# Patient Record
Sex: Male | Born: 1955 | Race: White | Hispanic: No | Smoking: Never smoker
Health system: Southern US, Community
[De-identification: ages and names within clinical notes are randomized; demographics above are authoritative.]

## PROBLEM LIST (undated history)

## (undated) DIAGNOSIS — I712 Thoracic aortic aneurysm, without rupture, unspecified: Secondary | ICD-10-CM

## (undated) DIAGNOSIS — I2699 Other pulmonary embolism without acute cor pulmonale: Secondary | ICD-10-CM

## (undated) DIAGNOSIS — I10 Essential (primary) hypertension: Secondary | ICD-10-CM

## (undated) HISTORY — PX: HAND SURGERY: SHX662

## (undated) HISTORY — PX: KNEE ARTHROSCOPY: SUR90

## (undated) HISTORY — PX: THYROID LOBECTOMY: SHX420

## (undated) HISTORY — DX: Thoracic aortic aneurysm, without rupture: I71.2

## (undated) HISTORY — DX: Thoracic aortic aneurysm, without rupture, unspecified: I71.20

---

## 2008-05-18 ENCOUNTER — Inpatient Hospital Stay: Payer: Self-pay | Admitting: Specialist

## 2010-02-12 ENCOUNTER — Emergency Department: Payer: Self-pay | Admitting: Emergency Medicine

## 2011-10-12 ENCOUNTER — Emergency Department: Payer: Self-pay | Admitting: Unknown Physician Specialty

## 2011-10-12 LAB — BASIC METABOLIC PANEL
Anion Gap: 8 (ref 7–16)
Calcium, Total: 9.3 mg/dL (ref 8.5–10.1)
Chloride: 108 mmol/L — ABNORMAL HIGH (ref 98–107)
Co2: 28 mmol/L (ref 21–32)
Creatinine: 1.07 mg/dL (ref 0.60–1.30)
EGFR (African American): >60
Osmolality: 289 (ref 275–301)

## 2011-10-12 LAB — CBC
MCV: 89 fL (ref 80–100)
Platelet: 161 10*3/uL (ref 150–440)
RDW: 13.6 % (ref 11.5–14.5)
WBC: 9.6 10*3/uL (ref 3.8–10.6)

## 2011-10-12 LAB — CK TOTAL AND CKMB (NOT AT ARMC): CK, Total: 267 U/L — ABNORMAL HIGH (ref 35–232)

## 2011-10-12 LAB — TROPONIN I: Troponin-I: <0.02 ng/mL

## 2011-10-13 LAB — HEPATIC FUNCTION PANEL A (ARMC)
Albumin: 3.7 g/dL (ref 3.4–5.0)
Alkaline Phosphatase: 76 U/L (ref 50–136)
Bilirubin,Total: 0.3 mg/dL (ref 0.2–1.0)
SGOT(AST): 24 U/L (ref 15–37)
SGPT (ALT): 36 U/L
Total Protein: 6.9 g/dL (ref 6.4–8.2)

## 2011-10-13 LAB — URINALYSIS, COMPLETE
Bacteria: NONE SEEN
Glucose,UR: NEGATIVE mg/dL (ref 0–75)
Leukocyte Esterase: NEGATIVE
Nitrite: NEGATIVE
Ph: 5 (ref 4.5–8.0)
Protein: NEGATIVE
RBC,UR: 1 /HPF (ref 0–5)
Specific Gravity: 1.028 (ref 1.003–1.030)
WBC UR: 1 /HPF (ref 0–5)

## 2011-10-13 LAB — MAGNESIUM: Magnesium: 2.2 mg/dL

## 2011-10-13 LAB — TSH: Thyroid Stimulating Horm: 0.749 u[IU]/mL

## 2011-12-29 ENCOUNTER — Emergency Department: Payer: Self-pay | Admitting: *Deleted

## 2011-12-29 LAB — COMPREHENSIVE METABOLIC PANEL
Albumin: 4.2 g/dL (ref 3.4–5.0)
Alkaline Phosphatase: 78 U/L (ref 50–136)
Anion Gap: 10 (ref 7–16)
Chloride: 106 mmol/L (ref 98–107)
Co2: 26 mmol/L (ref 21–32)
Creatinine: 0.95 mg/dL (ref 0.60–1.30)
EGFR (Non-African Amer.): >60
Osmolality: 283 (ref 275–301)
Potassium: 3.8 mmol/L (ref 3.5–5.1)
SGOT(AST): 25 U/L (ref 15–37)
Sodium: 142 mmol/L (ref 136–145)

## 2011-12-29 LAB — CBC
HCT: 44.8 % (ref 40.0–52.0)
MCHC: 34.4 g/dL (ref 32.0–36.0)
MCV: 87 fL (ref 80–100)
Platelet: 180 10*3/uL (ref 150–440)
RBC: 5.17 10*6/uL (ref 4.40–5.90)

## 2011-12-29 LAB — TROPONIN I: Troponin-I: <0.02 ng/mL

## 2012-01-12 ENCOUNTER — Ambulatory Visit: Payer: Self-pay | Admitting: Internal Medicine

## 2013-03-26 ENCOUNTER — Ambulatory Visit: Payer: Self-pay | Admitting: Physician Assistant

## 2013-12-29 IMAGING — CR DG ABDOMEN 3V
1 series · 6 of 6 positions shown · non-contrast
Comparison: none

REASON FOR EXAM: epigastric pain
COMMENTS:   May transport without cardiac monitor

[Series 1: w chest pa · 0.14mm/px · 6 of 6 slices shown]
[im 1/6]
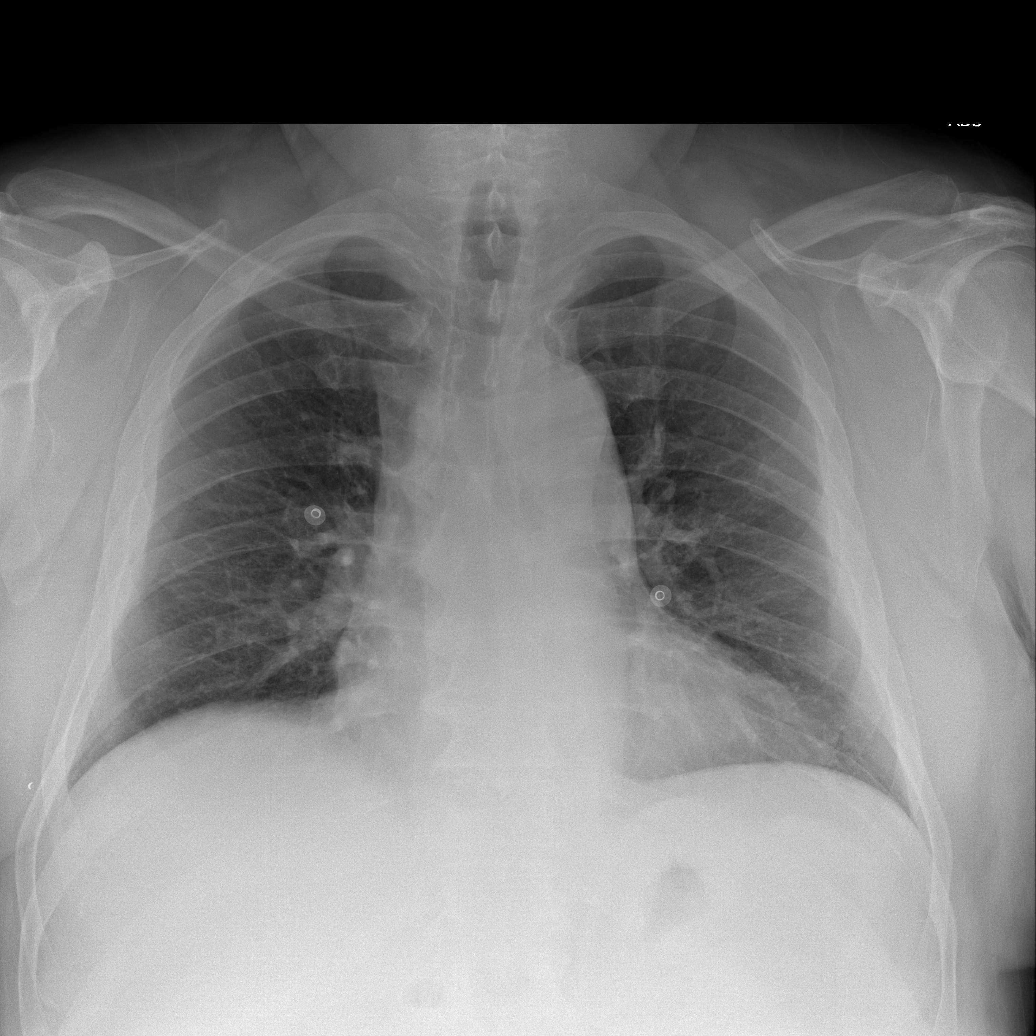
[im 2/6]
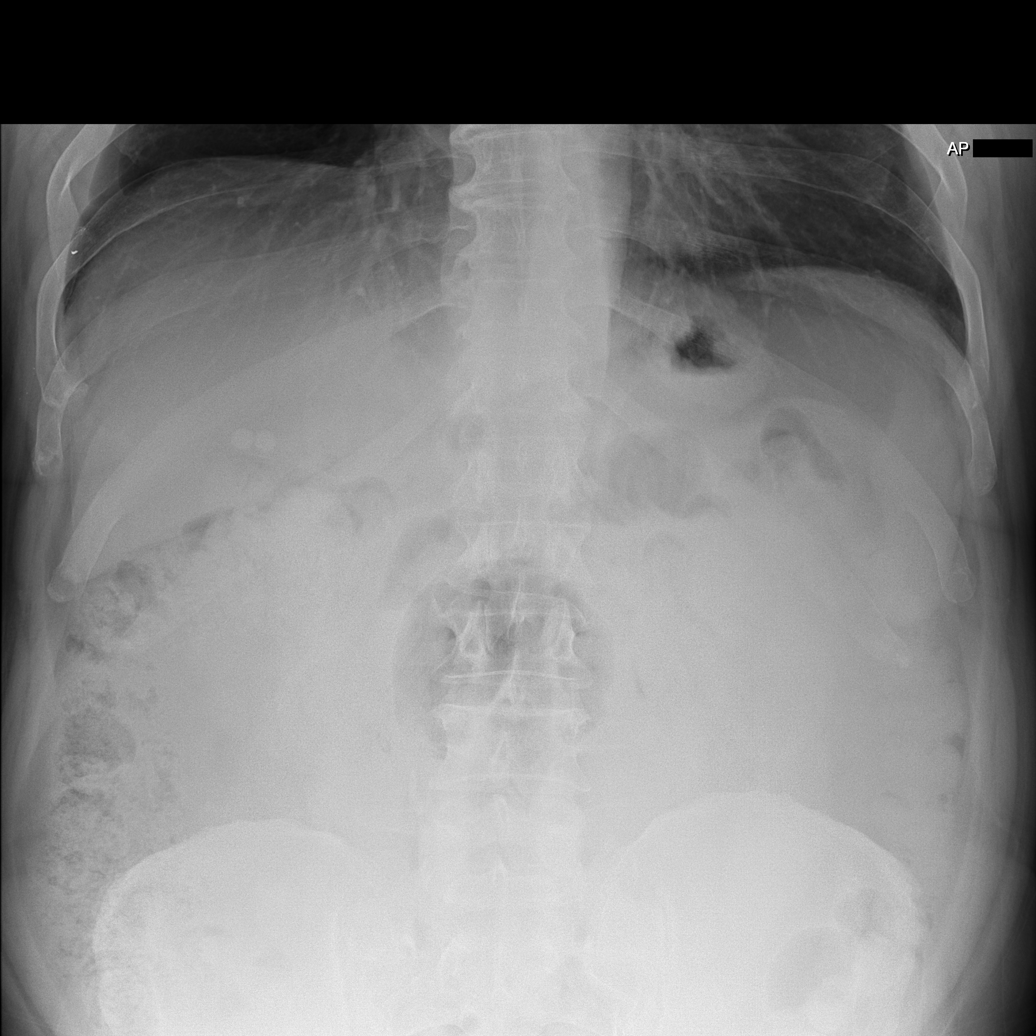
[im 3/6]
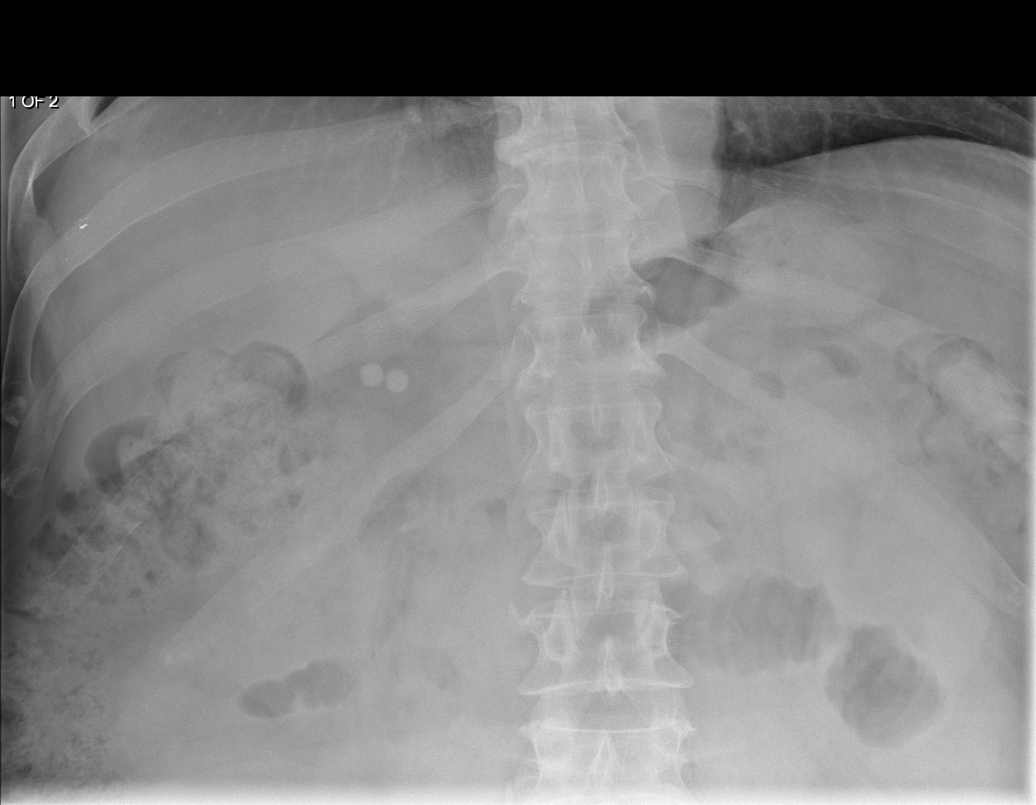
[im 4/6]
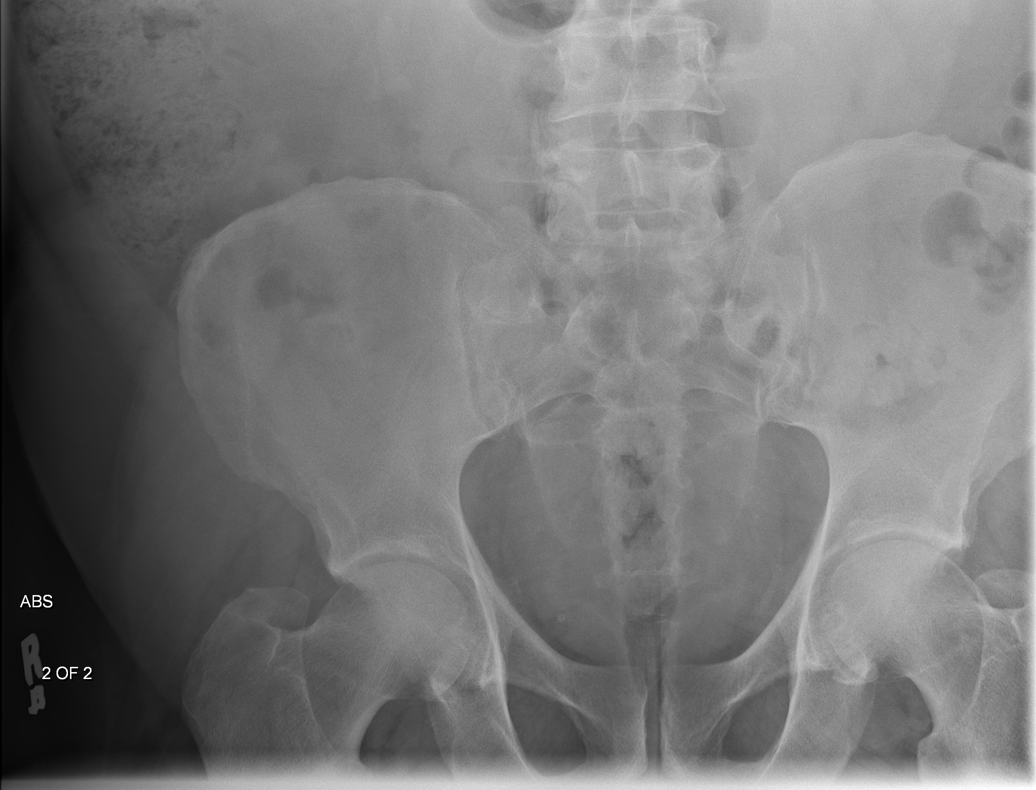
[im 5/6]
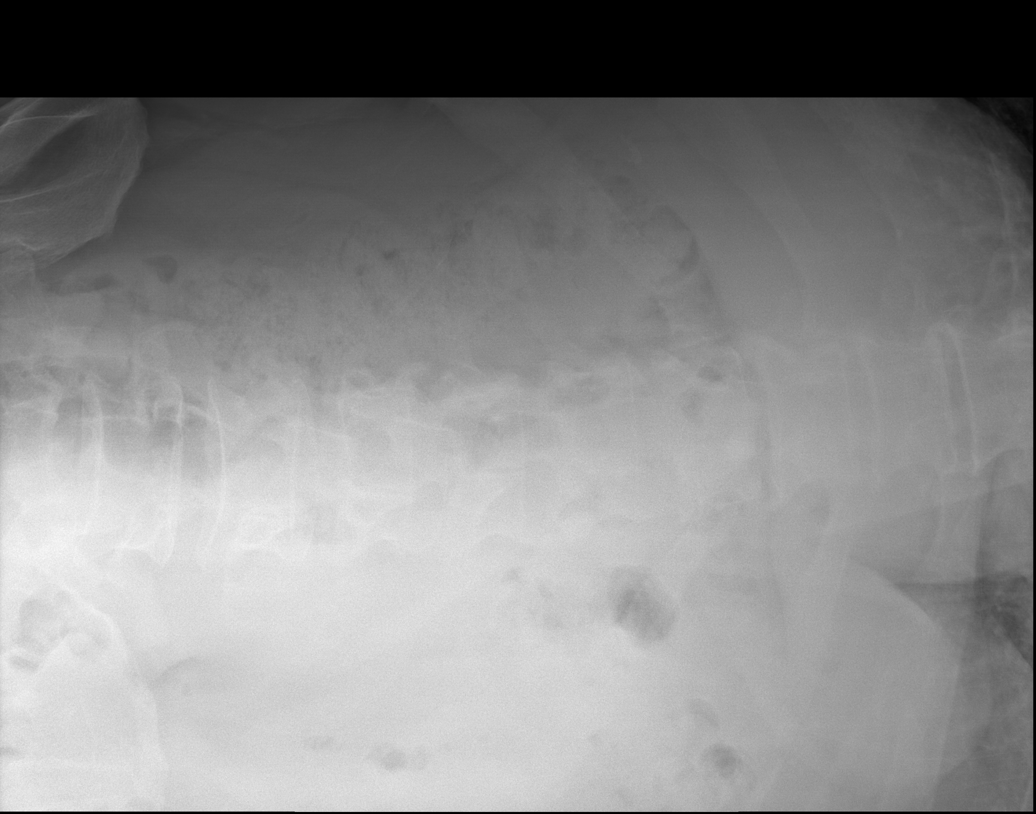
[im 6/6]
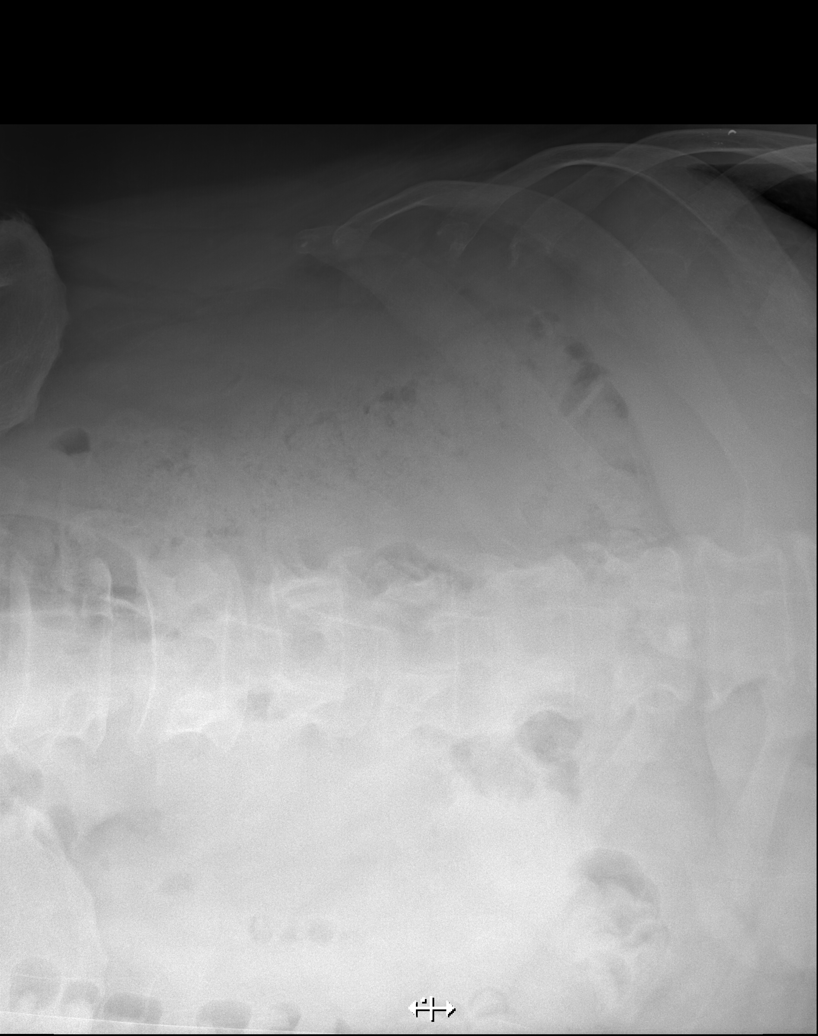

[6 of 6 positions shown; findings below may reference images not displayed]

PROCEDURE:     DXR - DXR ABDOMEN 3-WAY (INCL PA CXR)  - December 29, 2011  [DATE]

RESULT:     Comparison made to study 18 May, 2008.

The lungs are adequately inflated and clear. The cardiac silhouette is top
normal in size. The pulmonary vascularity is not engorged. There is no
pleural effusion. Within the abdomen the bowel gas pattern is relatively
nonspecific. No free extraluminal gas collections are demonstrated. There
are 2 rounded calcifications in the right upper quadrant most compatible
with gallstones. There is a moderate amount of stool within the right and
left colon. A small amount of small bowel gas is demonstrated. There is gas
in the rectum.
IMPRESSION: 1. There is no evidence of bowel obstruction or ileus. There may be
constipation present.
2. Two rounded calcific densities in the right upper quadrant of the abdomen
may reflect gallstones.
3. There is no evidence of acute cardiopulmonary abnormality.

[REDACTED]

## 2014-09-19 ENCOUNTER — Emergency Department: Payer: Federal, State, Local not specified - PPO

## 2014-09-19 ENCOUNTER — Other Ambulatory Visit: Payer: Self-pay

## 2014-09-19 ENCOUNTER — Emergency Department
Admission: EM | Admit: 2014-09-19 | Discharge: 2014-09-19 | Disposition: A | Discharge status: Home / Self Care | Payer: Federal, State, Local not specified - PPO | Attending: Emergency Medicine | Admitting: Emergency Medicine

## 2014-09-19 ENCOUNTER — Encounter: Payer: Self-pay | Admitting: Emergency Medicine

## 2014-09-19 DIAGNOSIS — R52 Pain, unspecified: Secondary | ICD-10-CM

## 2014-09-19 DIAGNOSIS — Z880 Allergy status to penicillin: Secondary | ICD-10-CM | POA: Insufficient documentation

## 2014-09-19 DIAGNOSIS — I10 Essential (primary) hypertension: Secondary | ICD-10-CM | POA: Diagnosis not present

## 2014-09-19 DIAGNOSIS — I712 Thoracic aortic aneurysm, without rupture: Secondary | ICD-10-CM | POA: Diagnosis not present

## 2014-09-19 DIAGNOSIS — R05 Cough: Secondary | ICD-10-CM | POA: Diagnosis present

## 2014-09-19 DIAGNOSIS — R0789 Other chest pain: Secondary | ICD-10-CM | POA: Diagnosis not present

## 2014-09-19 DIAGNOSIS — R079 Chest pain, unspecified: Secondary | ICD-10-CM

## 2014-09-19 HISTORY — DX: Essential (primary) hypertension: I10

## 2014-09-19 HISTORY — DX: Other pulmonary embolism without acute cor pulmonale: I26.99

## 2014-09-19 LAB — BASIC METABOLIC PANEL
ANION GAP: 10 (ref 5–15)
BUN: 16 mg/dL (ref 6–20)
CHLORIDE: 104 mmol/L (ref 101–111)
CO2: 25 mmol/L (ref 22–32)
CREATININE: 1.08 mg/dL (ref 0.61–1.24)
Calcium: 9.3 mg/dL (ref 8.9–10.3)
GFR calc Af Amer: >60 mL/min (ref >60)
GFR calc non Af Amer: >60 mL/min (ref >60)
Glucose, Bld: 96 mg/dL (ref 65–99)
Potassium: 3.7 mmol/L (ref 3.5–5.1)
SODIUM: 139 mmol/L (ref 135–145)

## 2014-09-19 LAB — URINALYSIS COMPLETE WITH MICROSCOPIC (ARMC ONLY)
BILIRUBIN URINE: NEGATIVE
Bacteria, UA: NONE SEEN
Glucose, UA: NEGATIVE mg/dL
KETONES UR: NEGATIVE mg/dL
Leukocytes, UA: NEGATIVE
NITRITE: NEGATIVE
PH: 5 (ref 5.0–8.0)
Protein, ur: NEGATIVE mg/dL
RBC / HPF: NONE SEEN RBC/hpf (ref 0–5)
SPECIFIC GRAVITY, URINE: 1.019 (ref 1.005–1.030)

## 2014-09-19 LAB — CBC
HCT: 52.5 % — ABNORMAL HIGH (ref 40.0–52.0)
HEMOGLOBIN: 17 g/dL (ref 13.0–18.0)
MCH: 28.2 pg (ref 26.0–34.0)
MCHC: 32.3 g/dL (ref 32.0–36.0)
MCV: 87.2 fL (ref 80.0–100.0)
Platelets: 192 10*3/uL (ref 150–440)
RBC: 6.02 MIL/uL — ABNORMAL HIGH (ref 4.40–5.90)
RDW: 14.5 % (ref 11.5–14.5)
WBC: 9.3 10*3/uL (ref 3.8–10.6)

## 2014-09-19 LAB — HEPATIC FUNCTION PANEL
ALBUMIN: 4.2 g/dL (ref 3.5–5.0)
ALT: 29 U/L (ref 17–63)
AST: 21 U/L (ref 15–41)
Alkaline Phosphatase: 60 U/L (ref 38–126)
Bilirubin, Direct: 0.1 mg/dL (ref 0.1–0.5)
Indirect Bilirubin: 0.8 mg/dL (ref 0.3–0.9)
Total Bilirubin: 0.9 mg/dL (ref 0.3–1.2)
Total Protein: 7.9 g/dL (ref 6.5–8.1)

## 2014-09-19 LAB — TROPONIN I

## 2014-09-19 LAB — LIPASE, BLOOD: Lipase: 29 U/L (ref 22–51)

## 2014-09-19 MED ORDER — IOHEXOL 350 MG/ML SOLN
100.0000 mL | Freq: Once | INTRAVENOUS | Status: AC | PRN
  Administered 2014-09-19: 100 mL via INTRAVENOUS
  Filled 2014-09-19: qty 100

## 2014-09-19 NOTE — ED Provider Notes (Signed)
I have reviewed the mid-level's documentation and was available to the mid-level if needed during evaluation of this patient.  I've spoken with Jose Stewart about the care of this patient. He had vague nondescript burning, warm sensation through his lower chest and upper abdomen. Next line  ED ECG REPORT I, Chiyeko Ferre W, the attending physician, personally viewed and interpreted this ECG.   Date: 09/19/2014  EKG Time: 11:59 AM  Rate: 79  Rhythm:  Normal sinus rhythm  Axis: left axis at -33  Intervals:  Normal  ST&T Change:  Slightly downgoing T wave in lead 3   Darien Ramus, MD 09/19/14 1711

## 2014-09-19 NOTE — ED Notes (Signed)
Pt presents with cough, fever and feelings of burning on the inside of his body for one week. Pt also reports n/v/d. Pt reports pulling four ticks off of him about 10 days ago.

## 2014-09-19 NOTE — ED Provider Notes (Signed)
[facility] Emergency Department Provider Note  ____________________________________________  Time seen: 1504  I have reviewed the triage vital signs and the nursing notes.   HISTORY  Chief Complaint Cough    HPI Jose Stewart is a 59 y.o. 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 PE (pulmonary embolism)   . Hypertension     There are no active problems to display for this patient.   Past Surgical History  Procedure Laterality Date  . Hand surgery    . Knee arthroscopy      No current outpatient prescriptions on file.  Allergies Augmentin; Keflex; and Niaspan  No family history on file.  Social History History  Substance Use Topics  . Smoking status: Never Smoker   . Smokeless tobacco: Not on file  . Alcohol Use: No    Review of Systems Constitutional: No fever/chills Eyes: No visual changes. ENT: No sore throat. Cardiovascular: Denies chest pain. Respiratory: Denies shortness of breath. Gastrointestinal: No abdominal pain.  No nausea, no vomiting.  No diarrhea.  No constipation. Genitourinary: Negative for dysuria. Musculoskeletal: Negative for back pain. Skin: Negative for rash. Neurological: Negative for headaches, focal weakness or numbness.  10-point ROS otherwise  negative.  ____________________________________________   PHYSICAL EXAM:  VITAL SIGNS: ED Triage Vitals  Enc Vitals Group     BP 09/19/14 1150 153/106 mmHg     Pulse Rate 09/19/14 1150 83     Resp 09/19/14 1150 20     Temp 09/19/14 1150 98.1 F (36.7 C)     Temp Source 09/19/14 1150 Oral     SpO2 09/19/14 1150 96 %     Weight 09/19/14 1150 280 lb (127.007 kg)     Height 09/19/14 1150  (1.854 m)     Head Cir --      Peak Flow --      Pain Score 09/19/14 1151 7     Pain Loc --      Pain Edu? --      Excl. in GC? --     Constitutional: Alert and oriented. Well appearing and in no acute distress. Eyes: Conjunctivae are normal. PERRL. EOMI. Head: Atraumatic. Nose: No congestion/rhinnorhea. Mouth/Throat: Mucous membranes are moist.  Oropharynx non-erythematous. Neck: No stridor.   Hematological/Lymphatic/Immunilogical: No cervical lymphadenopathy. Cardiovascular: Normal rate, regular rhythm. Grossly normal heart sounds.  Good peripheral circulation. Respiratory: Normal respiratory effort.  No retractions. Lungs CTAB. Gastrointestinal: Soft and nontender. No distention. No abdominal bruits. No CVA tenderness. Musculoskeletal: No lower extremity tenderness nor edema.  No joint effusions. Neurologic:  Normal speech and language. No gross focal neurologic deficits are appreciated. Speech is normal. No gait instability. Skin:  Skin is warm, dry and intact. No rash noted. Psychiatric: Patient is mildly anxious. Speech and behavior are normal.  ____________________________________________  LABS (all labs ordered are listed, but only abnormal results are displayed)  Labs Reviewed  CBC - Abnormal; Notable for the following:    RBC 6.02 (*)    HCT 52.5 (*)    All other components within normal limits  URINALYSIS COMPLETEWITH MICROSCOPIC ([facility] ONLY) - Abnormal; Notable for the following:    Color, Urine YELLOW (*)    APPearance CLEAR (*)    Hgb urine dipstick 1+ (*)     Squamous Epithelial / LPF 0-5 (*)    All other components within normal limits  BASIC METABOLIC PANEL  TROPONIN I  HEPATIC FUNCTION PANEL  LIPASE, BLOOD     RADIOLOGY  IMPRESSION: No demonstrable pulmonary embolus.  Prominence of the ascending thoracic aorta with a maximum transverse diameter of 5.0 x 4.7 cm. Ascending thoracic aortic aneurysm. Recommend semi-annual imaging followup by CTA or MRA and referral to cardiothoracic surgery if not already obtained. This recommendation follows 2010 ACCF/AHA/AATS/ACR/ASA/SCA/SCAI/SIR/STS/SVM Guidelines for the Diagnosis and Management of Patients With Thoracic Aortic Disease. Circulation. 2010; 121: [id number]  No lung edema or consolidation.  No adenopathy.  Cholelithiasis. There is hepatic steatosis.   Electronically Signed By: Bretta Bang Chauna Osoria M.D. On: 09/19/2014 16:58 ____________________________________________   PROCEDURES  Procedure(s) performed: None  Critical Care performed: No  ____________________________________________   INITIAL IMPRESSION / ASSESSMENT AND PLAN / ED COURSE  Pertinent labs & imaging results that were available during my care of the patient were reviewed by me and considered in my medical decision making (see chart for details).  Initial impression on this patient is pain etiology is unclear as to what it is as well as having a develop and thoracic aortic aneurysm we discussed the finding with him from his CT scan whilst reassuring that the pleuritic type pain that is having is not from clots we do need [facility] for continued observation and management of this patient's labs were all within normal limits nothing strikingly could be causing his vague symptoms recommend that he discuss both the testosterone antidepressives and studies on with his primary care provider return here for any acute concerns or worsening  symptoms ____________________________________________   FINAL CLINICAL IMPRESSION(S) / ED DIAGNOSES  Final diagnoses:  Chest pain  Pain   pain of unclear etiology ascending thoracic aortic aneurysm   Koda Defrank Rosalyn Gess, PA-C 09/19/14 1808  Darien Ramus, MD 09/19/14 [phone]

## 2016-09-19 IMAGING — CT CT ANGIO CHEST
1 of 2 series · 18 of 30 positions shown · IV contrast (omnipaque)
Comparison: Chest radiograph September 19, 2014

CLINICAL DATA: Chest pain and cough ; fever

EXAM:
CT ANGIOGRAPHY CHEST WITH CONTRAST
TECHNIQUE: Multidetector CT imaging of the chest was performed using the
standard protocol during bolus administration of intravenous
contrast. Multiplanar CT image reconstructions and MIPs were
obtained to evaluate the vascular anatomy.
CONTRAST:  100mL OMNIPAQUE IOHEXOL 350 MG/ML SOLN

[Series 5: pe 1.0 thins · axial · 0.88mm/px · z∈[+6,+273]mm · 18 of 301 slices shown]
[im 17/301  lung]
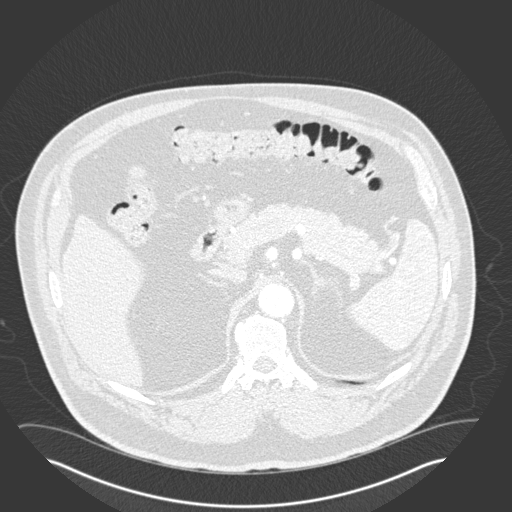
[im 34/301  mediastinal]
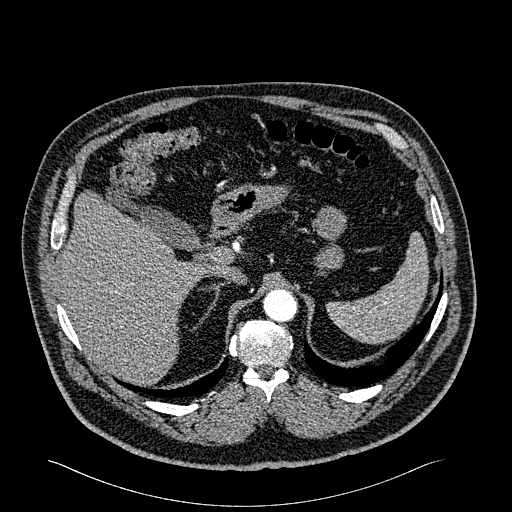
[im 51/301  lung]
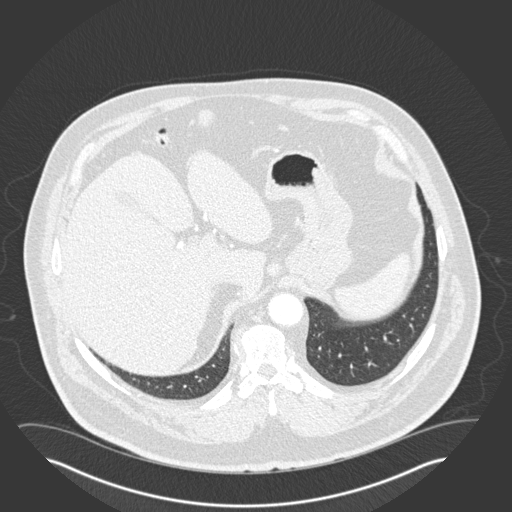
[im 67/301  mediastinal]
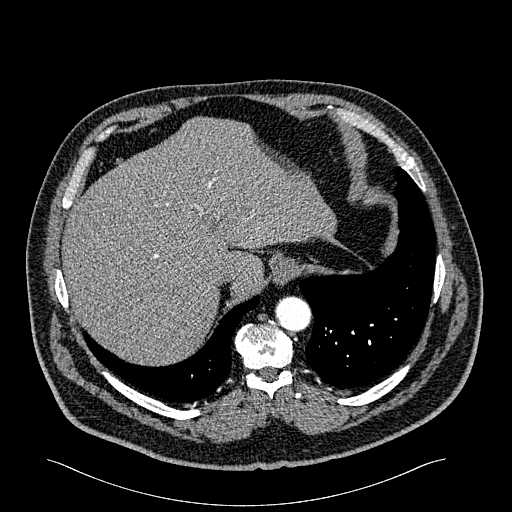
[im 84/301  lung]
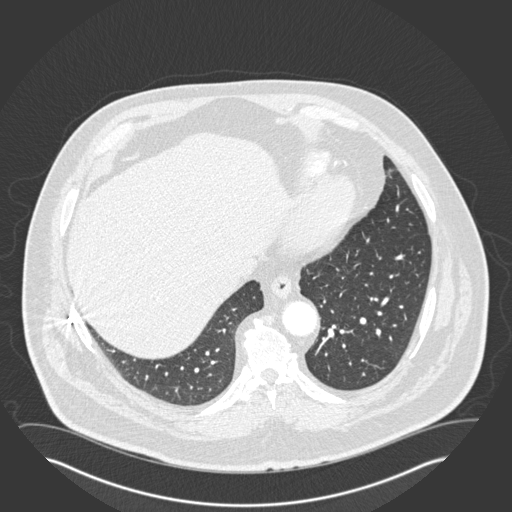
[im 101/301  mediastinal]
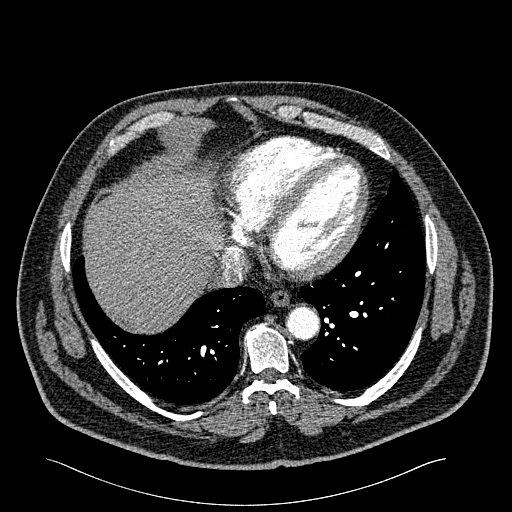
[im 117/301  lung]
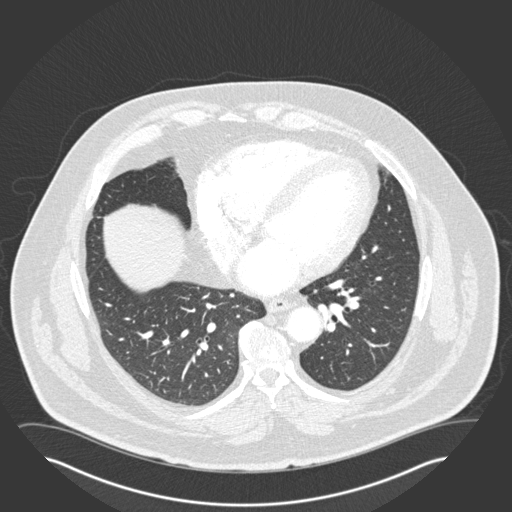
[im 134/301  mediastinal]
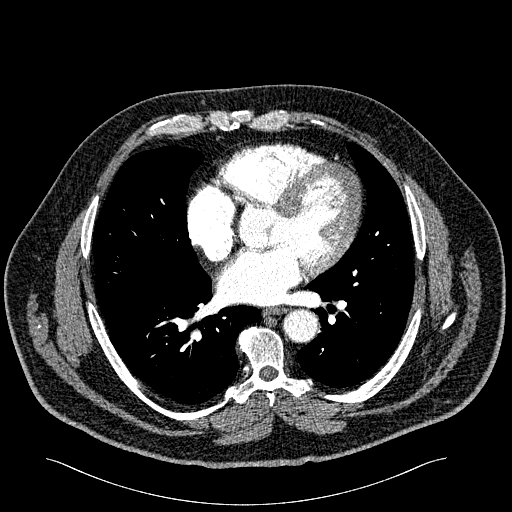
[im 141/301  lung]
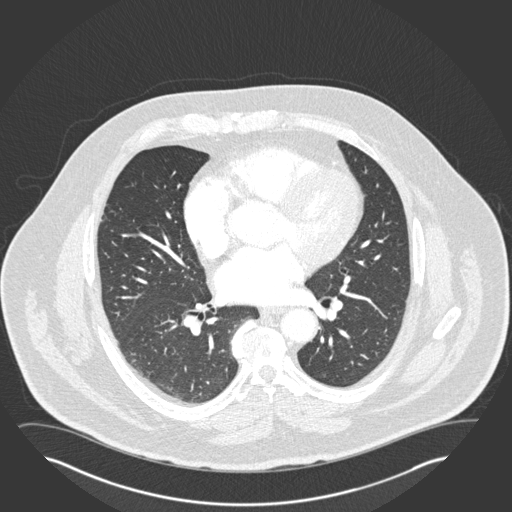
[im 151/301  mediastinal]
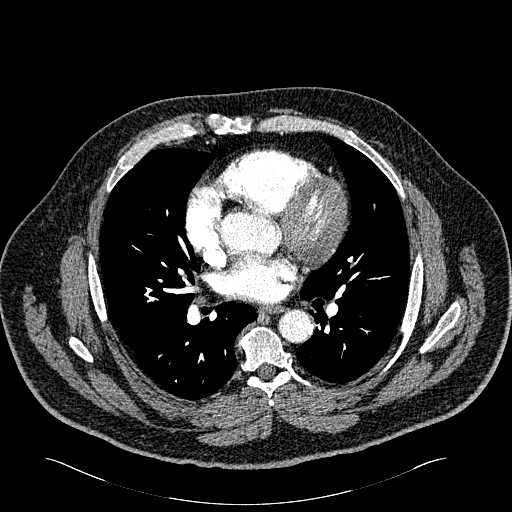
[im 167/301  lung]
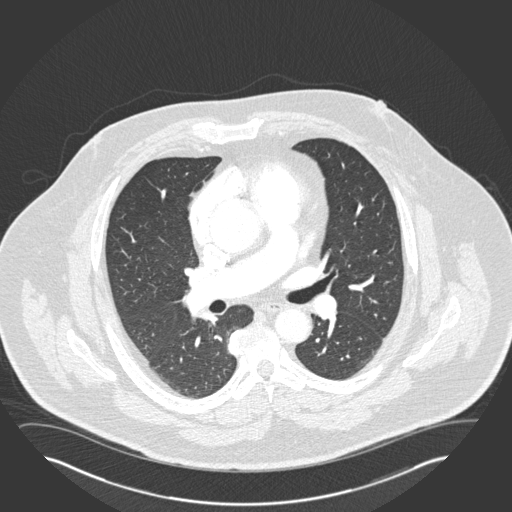
[im 184/301  mediastinal]
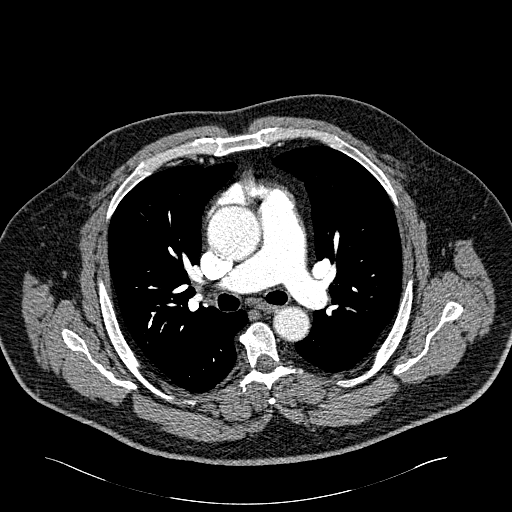
[im 201/301  lung]
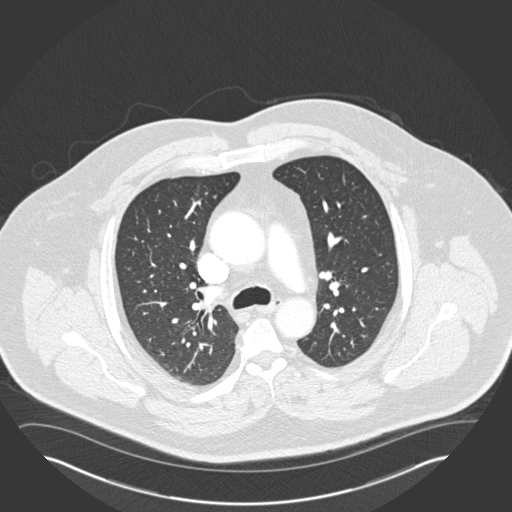
[im 217/301  mediastinal]
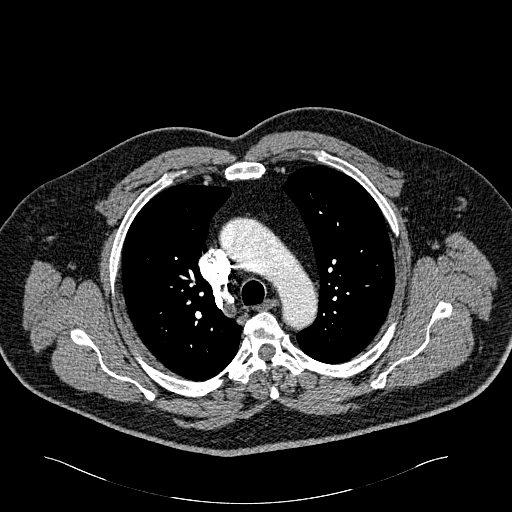
[im 234/301  lung]
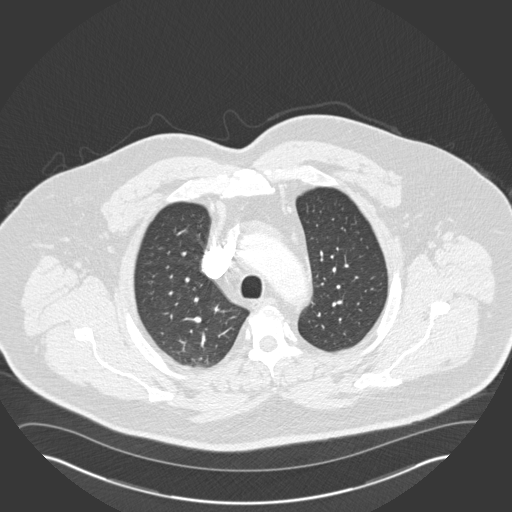
[im 251/301  mediastinal]
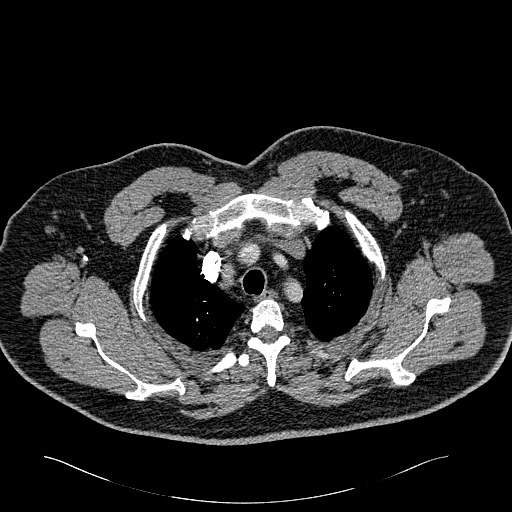
[im 267/301  lung]
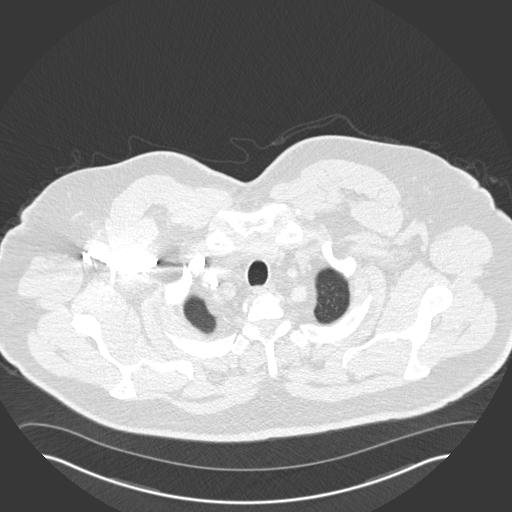
[im 284/301  mediastinal]
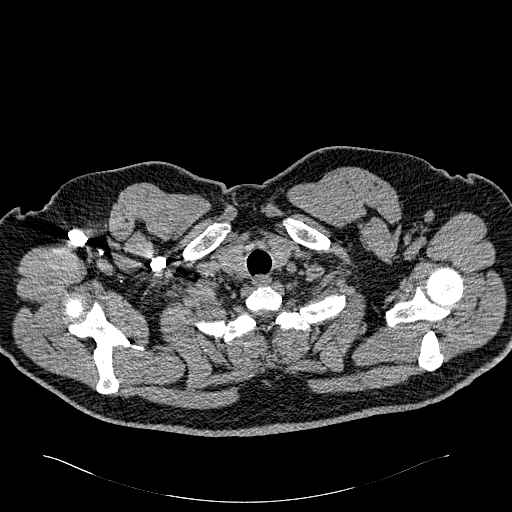

[18 of 30 positions shown; findings below may reference images not displayed]

FINDINGS: There is no demonstrable pulmonary embolus.

There is prominence of the ascending thoracic aorta with a maximum
transverse diameter of 5.0 x 4.7 cm. There is no dilatation of the
descending thoracic aorta. There is no demonstrable thoracic aortic
dissection.

There is no lung edema or consolidation.

Thyroid appears normal. Pericardium is not thickened. There is no
appreciable coronary artery calcification.

In the visualized upper abdomen, there is hepatic steatosis. There
is cholelithiasis.

There are no blastic or lytic bone lesions.

Review of the MIP images confirms the above findings.
IMPRESSION: No demonstrable pulmonary embolus.

Prominence of the ascending thoracic aorta with a maximum transverse
diameter of 5.0 x 4.7 cm. Ascending thoracic aortic aneurysm.
Recommend semi-annual imaging followup by CTA or MRA and referral to
cardiothoracic surgery if not already obtained. This recommendation
follows 2818 ACCF/AHA/AATS/ACR/ASA/SCA/LONGEYAREEO/CAUCASUS/CEEJAY/VILAR RODRIGUES Guidelines
for the Diagnosis and Management of Patients With Thoracic Aortic
Disease. Circulation. 2818; 121: [id number]

No lung edema or consolidation.

No adenopathy.

Cholelithiasis.  There is hepatic steatosis.

## 2019-12-03 ENCOUNTER — Ambulatory Visit (INDEPENDENT_AMBULATORY_CARE_PROVIDER_SITE_OTHER): Payer: Medicare Other | Admitting: Surgery

## 2019-12-03 ENCOUNTER — Other Ambulatory Visit: Payer: Self-pay

## 2019-12-03 ENCOUNTER — Encounter: Payer: Self-pay | Admitting: Surgery

## 2019-12-03 VITALS — BP 165/84 | HR 64 | Temp 98.3°F | Resp 12 | Ht 73.0 in | Wt 330.6 lb

## 2019-12-03 DIAGNOSIS — K5900 Constipation, unspecified: Secondary | ICD-10-CM | POA: Diagnosis not present

## 2019-12-03 DIAGNOSIS — K8020 Calculus of gallbladder without cholecystitis without obstruction: Secondary | ICD-10-CM | POA: Insufficient documentation

## 2019-12-03 DIAGNOSIS — Z860100 Personal history of colon polyps, unspecified: Secondary | ICD-10-CM | POA: Insufficient documentation

## 2019-12-03 DIAGNOSIS — K644 Residual hemorrhoidal skin tags: Secondary | ICD-10-CM

## 2019-12-03 DIAGNOSIS — Z800 Family history of malignant neoplasm of digestive organs: Secondary | ICD-10-CM

## 2019-12-03 NOTE — Patient Instructions (Addendum)
As discussed with Dr Claudine Mouton; start taking in foods with high fibers. You may take supplements as well such as metamucil, fiber gummies, etc.  Follow up as needed. Call the office if you have any questions or concerns.   Fiber Content in Foods  See the following list for the dietary fiber content of some common foods. High-fiber foods High-fiber foods contain 4 grams or more (4g or more) of fiber per serving. They include:  Artichoke (fresh) -- 1 medium has 10.3g of fiber.  Baked beans, plain or vegetarian (canned) --  cup has 5.2g of fiber.  Blackberries or raspberries (fresh) --  cup has 4g of fiber.  Bran cereal --  cup has 8.6g of fiber.  Bulgur (cooked) --  cup has 4g of fiber.  Kidney beans (canned) --  cup has 6.8g of fiber.  Lentils (cooked) --  cup has 7.8g of fiber.  Pear (fresh) -- 1 medium has 5.1g of fiber.  Peas (frozen) --  cup has 4.4g of fiber.  Pinto beans (canned) --  cup has 5.5g of fiber.  Pinto beans (dried and cooked) --  cup has 7.7g of fiber.  Potato with skin (baked) -- 1 medium has 4.4g of fiber.  Quinoa (cooked) --  cup has 5g of fiber.  Soybeans (canned, frozen, or fresh) --  cup has 5.1g of fiber. Moderate-fiber foods Moderate-fiber foods contain 1-4 grams (1-4g) of fiber per serving. They include:  Almonds -- 1 oz. has 3.5g of fiber.  Apple with skin -- 1 medium has 3.3g of fiber.  Applesauce, sweetened --  cup has 1.5g of fiber.  Bagel, plain -- one 4-inch (10-cm) bagel has 2g of fiber.  Banana -- 1 medium has 3.1g of fiber.  Broccoli (cooked) --  cup has 2.5g of fiber.  Carrots (cooked) --  cup has 2.3g of fiber.  Corn (canned or frozen) --  cup has 2.1g of fiber.  Corn tortilla -- one 6-inch (15-cm) tortilla has 1.5g of fiber.  Green beans (canned) --  cup has 2g of fiber.  Instant oatmeal --  cup has about 2g of fiber.  Long-grain brown rice (cooked) -- 1 cup has 3.5g of fiber.  Macaroni, enriched  (cooked) -- 1 cup has 2.5g of fiber.  Melon -- 1 cup has 1.4g of fiber.  Multigrain cereal --  cup has about 2-4g of fiber.  Orange -- 1 small has 3.1g of fiber.  Potatoes, mashed --  cup has 1.6g of fiber.  Raisins -- 1/4 cup has 1.6g of fiber.  Squash --  cup has 2.9g of fiber.  Sunflower seeds --  cup has 1.1g of fiber.  Tomato -- 1 medium has 1.5g of fiber.  Vegetable or soy patty -- 1 has 3.4g of fiber.  Whole-wheat bread -- 1 slice has 2g of fiber.  Whole-wheat spaghetti --  cup has 3.2g of fiber. Low-fiber foods Low-fiber foods contain less than 1 gram (less than 1g) of fiber per serving. They include:  Egg -- 1 large.  Flour tortilla -- one 6-inch (15-cm) tortilla.  Fruit juice --  cup.  Lettuce -- 1 cup.  Meat, poultry, or fish -- 1 oz.  Milk -- 1 cup.  Spinach (raw) -- 1 cup.  White bread -- 1 slice.  White rice --  cup.  Yogurt --  cup. Actual amounts of fiber in foods may be different depending on processing. Talk with your dietitian about how much fiber you need in your diet. This information  is not intended to replace advice given to you by your health care provider. Make sure you discuss any questions you have with your health care provider. Document Revised: 11/12/2015 Document Reviewed: 05/14/2015 Elsevier Patient Education  2020 [employer]  High-Fiber Diet Fiber, also called dietary fiber, is a type of carbohydrate that is found in fruits, vegetables, whole grains, and beans. A high-fiber diet can have many health benefits. Your health care provider may recommend a high-fiber diet to help:  Prevent constipation. Fiber can make your bowel movements more regular.  Lower your cholesterol.  Relieve the following conditions: ? Swelling of veins in the anus (hemorrhoids). ? Swelling and irritation (inflammation) of specific areas of the digestive tract (uncomplicated diverticulosis). ? A problem of the large intestine (colon) that  sometimes causes pain and diarrhea (irritable bowel syndrome, IBS).  Prevent overeating as part of a weight-loss plan.  Prevent heart disease, type 2 diabetes, and certain cancers. What is my plan? The recommended daily fiber intake in grams (g) includes:  38 g for men age 14 or younger.  30 g for men over age 67.  25 g for women age 74 or younger.  21 g for women over age 39. You can get the recommended daily intake of dietary fiber by:  Eating a variety of fruits, vegetables, grains, and beans.  Taking a fiber supplement, if it is not possible to get enough fiber through your diet. What do I need to know about a high-fiber diet?  It is better to get fiber through food sources rather than from fiber supplements. There is not a lot of research about how effective supplements are.  Always check the fiber content on the nutrition facts label of any prepackaged food. Look for foods that contain 5 g of fiber or more per serving.  Talk with a diet and nutrition specialist (dietitian) if you have questions about specific foods that are recommended or not recommended for your medical condition, especially if those foods are not listed below.  Gradually increase how much fiber you consume. If you increase your intake of dietary fiber too quickly, you may have bloating, cramping, or gas.  Drink plenty of water. Water helps you to digest fiber. What are tips for following this plan?  Eat a wide variety of high-fiber foods.  Make sure that half of the grains that you eat each day are whole grains.  Eat breads and cereals that are made with whole-grain flour instead of refined flour or white flour.  Eat brown rice, bulgur wheat, or millet instead of white rice.  Start the day with a breakfast that is high in fiber, such as a cereal that contains 5 g of fiber or more per serving.  Use beans in place of meat in soups, salads, and pasta dishes.  Eat high-fiber snacks, such as berries,  raw vegetables, nuts, and popcorn.  Choose whole fruits and vegetables instead of processed forms like juice or sauce. What foods can I eat?  Fruits Berries. Pears. Apples. Oranges. Avocado. Prunes and raisins. Dried figs. Vegetables Sweet potatoes. Spinach. Kale. Artichokes. Cabbage. Broccoli. Cauliflower. Green peas. Carrots. Squash. Grains Whole-grain breads. Multigrain cereal. Oats and oatmeal. Brown rice. Barley. Bulgur wheat. Millet. Quinoa. Bran muffins. Popcorn. Rye wafer crackers. Meats and other proteins Navy, kidney, and pinto beans. Soybeans. Split peas. Lentils. Nuts and seeds. Dairy Fiber-fortified yogurt. Beverages Fiber-fortified soy milk. Fiber-fortified orange juice. Other foods Fiber bars. The items listed above may not be a complete list  of recommended foods and beverages. Contact a dietitian for more options. What foods are not recommended? Fruits Fruit juice. Cooked, strained fruit. Vegetables Fried potatoes. Canned vegetables. Well-cooked vegetables. Grains White bread. Pasta made with refined flour. White rice. Meats and other proteins Fatty cuts of meat. Fried chicken or fried fish. Dairy Milk. Yogurt. Cream cheese. Sour cream. Fats and oils Butters. Beverages Soft drinks. Other foods Cakes and pastries. The items listed above may not be a complete list of foods and beverages to avoid. Contact a dietitian for more information. Summary  Fiber is a type of carbohydrate. It is found in fruits, vegetables, whole grains, and beans.  There are many health benefits of eating a high-fiber diet, such as preventing constipation, lowering blood cholesterol, helping with weight loss, and reducing your risk of heart disease, diabetes, and certain cancers.  Gradually increase your intake of fiber. Increasing too fast can result in cramping, bloating, and gas. Drink plenty of water while you increase your fiber.  The best sources of fiber include whole fruits  and vegetables, whole grains, nuts, seeds, and beans. This information is not intended to replace advice given to you by your health care provider. Make sure you discuss any questions you have with your health care provider. Document Revised: 01/23/2017 Document Reviewed: 01/23/2017 Elsevier Patient Education  2020 [employer].

## 2019-12-03 NOTE — Progress Notes (Addendum)
Patient ID: Jose Stewart, male   DOB: 1955/06/12, 64 y.o.   MRN: [id number]  Chief Complaint: Referred for external hemorrhoids, questionable history of thrombosis.  History of Present Illness Jose Stewart is a 64 y.o. male with hemorrhoidal discomfort/pain approximately a couple months prior to his visit at the [place] earlier this month.  He denies any history of known thrombosis.  His bowel activity is approximately once per day, he often will skip a day.  He denies seeing blood in his stools.  He reports pain burning and itching but has not had any of late.  He has utilized sitz bath's.  He is also utilize suppositories and wipes.  He has a family history of colon cancer in his sister and he has been on a regimen of surveillance with colonoscopies.  His last colonoscopy was 3 years ago and I found a couple polyps at that time and is due now.  That was done at the [place].  Past Medical History Past Medical History:  Diagnosis Date  . Hypertension   . PE (pulmonary embolism)   . Thoracic aortic aneurysm [facility])       Past Surgical History:  Procedure Laterality Date  . HAND SURGERY    . KNEE ARTHROSCOPY    . THYROID LOBECTOMY Right     Allergies  Allergen Reactions  . Augmentin [Amoxicillin-Pot Clavulanate]   . Keflex [Cephalexin]   . Niaspan [Niacin Er]     Current Outpatient Medications  Medication Sig Dispense Refill  . atorvastatin (LIPITOR) 80 MG tablet Take by mouth.    . betamethasone dipropionate (DIPROLENE) 0.05 % ointment Apply topically.    . famotidine (PEPCID) 40 MG tablet Take 40 mg by mouth 2 (two) times daily.    . hydrochlorothiazide (HYDRODIURIL) 25 MG tablet Take 25 mg by mouth daily.    . meloxicam (MOBIC) 15 MG tablet Take 15 mg by mouth daily.    . metFORMIN (GLUCOPHAGE) 500 MG tablet Take 500 mg by mouth daily.    . metoprolol tartrate (LOPRESSOR) 25 MG tablet Take 25 mg by mouth 2 (two) times daily.    Marland Kitchen omeprazole (PRILOSEC) 20 MG capsule Take 20  mg by mouth daily.     No current facility-administered medications for this visit.    Family History Family History  Problem Relation Age of Onset  . Heart disease Father       Social History Social History   Tobacco Use  . Smoking status: Never Smoker  . Smokeless tobacco: Never Used  Substance Use Topics  . Alcohol use: No  . Drug use: Never        Review of Systems  Constitutional: Negative for chills, fever, malaise/fatigue and weight loss.  HENT: Negative.   Eyes: Negative.   Respiratory: Negative.   Cardiovascular: Negative for chest pain and palpitations.  Gastrointestinal: Positive for constipation and heartburn. Negative for abdominal pain, blood in stool, diarrhea, melena, nausea and vomiting.  Genitourinary: Negative.   Skin: Negative for itching and rash.  Neurological: Positive for tingling. Negative for dizziness, sensory change, speech change and headaches.  Psychiatric/Behavioral: Negative.       Physical Exam Blood pressure (!) 165/84, pulse 64, temperature 98.3 F (36.8 C), resp. rate 12, height 6\' 1"  (1.854 m), weight (!) 330 lb 9.6 oz (150 kg), SpO2 95 %. Last Weight  Most recent update: 12/03/2019  9:33 AM   Weight  150 kg (330 lb 9.6 oz)  CONSTITUTIONAL: Well developed, and nourished, appropriately responsive and aware without distress.  Morbidly obese. EYES: Sclera non-icteric.   EARS, NOSE, MOUTH AND THROAT: Mask worn.  Hearing is intact to voice.  NECK: Trachea is midline, and there is no jugular venous distension.  LYMPH NODES:  Lymph nodes in the neck are not enlarged. RESPIRATORY:  Lungs are clear, and breath sounds are equal bilaterally. Normal respiratory effort without pathologic use of accessory muscles. CARDIOVASCULAR: Heart is regular in rate and rhythm. GI: The abdomen is well rounded, soft, nontender, and nondistended. There were no palpable masses. I did not appreciate hepatosplenomegaly. There were normal bowel  sounds. GU: Digital rectal exam revealed 1 prominent but soft skin tag at 2:00, coccyx equals 12:00. No significant internal hemorrhoidal piles, no appreciable fissure, induration or tenderness. MUSCULOSKELETAL:  Symmetrical muscle tone appreciated in all four extremities.    SKIN: Skin turgor is normal. No pathologic skin lesions appreciated.  NEUROLOGIC:  Motor and sensation appear grossly normal.  Cranial nerves are grossly without defect. PSYCH:  Alert and oriented to person, place and time. Affect is appropriate for situation.  Data Reviewed I have personally reviewed what is currently available of the patient's imaging, recent labs and medical records.   Labs:  CBC Latest Ref Rng & Units 09/19/2014 12/29/2011 10/12/2011  WBC 3.8 - 10.6 K/uL 9.3 9.4 9.6  Hemoglobin 13.0 - 18.0 g/dL 76.7 20.9 47.0  Hematocrit 40 - 52 % 52.5(H) 44.8 41.3  Platelets 150 - 440 K/uL 192 180 161   CMP Latest Ref Rng & Units 09/19/2014 12/29/2011 10/13/2011  Glucose 65 - 99 mg/dL 96 86 -  BUN 6 - 20 mg/dL 16 13 -  Creatinine 9.62 - 1.24 mg/dL 8.36 6.29 -  Sodium 476 - 145 mmol/L 139 142 -  Potassium 3.5 - 5.1 mmol/L 3.7 3.8 -  Chloride 101 - 111 mmol/L 104 106 -  CO2 22 - 32 mmol/L 25 26 -  Calcium 8.9 - 10.3 mg/dL 9.3 9.6 -  Total Protein 6.5 - 8.1 g/dL 7.9 8.1 6.9  Total Bilirubin 0.3 - 1.2 mg/dL 0.9 0.4 0.3  Alkaline Phos 38 - 126 U/L 60 78 76  AST 15 - 41 U/L 21 25 24   ALT 17 - 63 U/L 29 39 36      Imaging: Prior chest CT showed: Cholelithiasis. Within last 24 hrs: No results found.  Assessment    Constipation. History of external hemorrhoids. Asymptomatic gallstones. Family history of colon cancer, personal history of colonic polyps. Patient Active Problem List   Diagnosis Date Noted  . Residual hemorrhoidal skin tags 12/03/2019  . Cholelithiasis 12/03/2019  . Constipation 12/03/2019  . Family history of colon cancer 12/03/2019  . Personal history of colonic polyps 12/03/2019     Plan    We discussed the role of fiber, fluids and MiraLAX in that order to manage his constipation.  We discussed the goals of taking each and the rescue benefit of MiraLAX.  I believe he understands and desires to pursue this course as I have nothing to offer him from a surgical perspective.  Questions answered.  Printed information given. It has been 3 years since his last colonoscopy, advised pursuing follow-up screening/surveillance colonoscopy.  He understands this and will pursue it either through the 12/05/2019 or locally if able. Face-to-face time spent with the patient and accompanying care providers(if present) was 30 minutes, with more than 50% of the time spent counseling, educating, and coordinating care of the patient.  Campbell Lerner M.D., FACS 12/03/2019, 11:15 AM
